# Patient Record
Sex: Female | Born: 1955 | Race: White | Hispanic: No | Marital: Married | Smoking: Never smoker
Health system: Southern US, Community
[De-identification: ages and names within clinical notes are randomized; demographics above are authoritative.]

---

## 2008-09-08 ENCOUNTER — Emergency Department (HOSPITAL_COMMUNITY): Admission: EM | Admit: 2008-09-08 | Discharge: 2008-09-08 | Payer: Self-pay | Admitting: Internal Medicine

## 2011-08-22 LAB — URINE MICROSCOPIC-ADD ON

## 2011-08-22 LAB — DIFFERENTIAL
Basophils Relative: 1
Eosinophils Absolute: 0
Lymphs Abs: 2.2
Neutrophils Relative %: 60

## 2011-08-22 LAB — URINALYSIS, ROUTINE W REFLEX MICROSCOPIC
Glucose, UA: >1000 — AB
Specific Gravity, Urine: 1.037 — ABNORMAL HIGH
Urobilinogen, UA: 0.2
pH: 6

## 2011-08-22 LAB — COMPREHENSIVE METABOLIC PANEL
ALT: 21
Albumin: 3.1 — ABNORMAL LOW
Alkaline Phosphatase: 122 — ABNORMAL HIGH
Potassium: 3.9
Sodium: 128 — ABNORMAL LOW
Total Protein: 7

## 2011-08-22 LAB — POCT I-STAT, CHEM 8
HCT: 37
Hemoglobin: 12.6
Potassium: 5.6 — ABNORMAL HIGH
Sodium: 130 — ABNORMAL LOW

## 2011-08-22 LAB — GLUCOSE, CAPILLARY

## 2011-08-22 LAB — CBC
MCV: 87
Platelets: 261
WBC: 6.8

## 2013-05-02 ENCOUNTER — Ambulatory Visit (HOSPITAL_COMMUNITY)
Admission: RE | Admit: 2013-05-02 | Discharge: 2013-05-02 | Disposition: A | Discharge status: Home / Self Care | Payer: BC Managed Care – PPO | Source: Ambulatory Visit | Attending: General Surgery | Admitting: General Surgery

## 2013-05-02 ENCOUNTER — Other Ambulatory Visit (HOSPITAL_BASED_OUTPATIENT_CLINIC_OR_DEPARTMENT_OTHER): Payer: Self-pay | Admitting: General Surgery

## 2013-05-02 ENCOUNTER — Encounter (HOSPITAL_BASED_OUTPATIENT_CLINIC_OR_DEPARTMENT_OTHER): Payer: BC Managed Care – PPO | Attending: General Surgery

## 2013-05-02 DIAGNOSIS — M869 Osteomyelitis, unspecified: Secondary | ICD-10-CM

## 2013-05-02 DIAGNOSIS — R059 Cough, unspecified: Secondary | ICD-10-CM | POA: Insufficient documentation

## 2013-05-02 DIAGNOSIS — T148XXA Other injury of unspecified body region, initial encounter: Secondary | ICD-10-CM

## 2013-05-02 DIAGNOSIS — M5380 Other specified dorsopathies, site unspecified: Secondary | ICD-10-CM | POA: Insufficient documentation

## 2013-05-02 DIAGNOSIS — R05 Cough: Secondary | ICD-10-CM | POA: Insufficient documentation

## 2013-05-02 DIAGNOSIS — Z794 Long term (current) use of insulin: Secondary | ICD-10-CM | POA: Insufficient documentation

## 2013-05-02 DIAGNOSIS — R0989 Other specified symptoms and signs involving the circulatory and respiratory systems: Secondary | ICD-10-CM | POA: Insufficient documentation

## 2013-05-02 DIAGNOSIS — I517 Cardiomegaly: Secondary | ICD-10-CM | POA: Insufficient documentation

## 2013-05-02 DIAGNOSIS — I10 Essential (primary) hypertension: Secondary | ICD-10-CM | POA: Insufficient documentation

## 2013-05-02 DIAGNOSIS — E119 Type 2 diabetes mellitus without complications: Secondary | ICD-10-CM | POA: Insufficient documentation

## 2013-05-02 DIAGNOSIS — M21969 Unspecified acquired deformity of unspecified lower leg: Secondary | ICD-10-CM | POA: Insufficient documentation

## 2013-05-02 DIAGNOSIS — E1169 Type 2 diabetes mellitus with other specified complication: Secondary | ICD-10-CM | POA: Insufficient documentation

## 2013-05-02 DIAGNOSIS — X58XXXA Exposure to other specified factors, initial encounter: Secondary | ICD-10-CM | POA: Insufficient documentation

## 2013-05-02 DIAGNOSIS — Z79899 Other long term (current) drug therapy: Secondary | ICD-10-CM | POA: Insufficient documentation

## 2013-05-02 DIAGNOSIS — S91109A Unspecified open wound of unspecified toe(s) without damage to nail, initial encounter: Secondary | ICD-10-CM | POA: Insufficient documentation

## 2013-05-02 DIAGNOSIS — L84 Corns and callosities: Secondary | ICD-10-CM | POA: Insufficient documentation

## 2013-05-02 DIAGNOSIS — M7730 Calcaneal spur, unspecified foot: Secondary | ICD-10-CM | POA: Insufficient documentation

## 2013-05-02 DIAGNOSIS — L97509 Non-pressure chronic ulcer of other part of unspecified foot with unspecified severity: Secondary | ICD-10-CM | POA: Insufficient documentation

## 2013-05-02 LAB — GLUCOSE, CAPILLARY: Glucose-Capillary: 173 mg/dL — ABNORMAL HIGH (ref 70–99)

## 2013-05-02 LAB — HEMOGLOBIN A1C: Mean Plasma Glucose: 223 mg/dL — ABNORMAL HIGH (ref <117)

## 2013-05-03 NOTE — Progress Notes (Signed)
[facility]  NAME:  Haley Stein, Haley Stein               ACCOUNT NO.:  [id number]  MEDICAL RECORD NO.:  [id number]      DATE OF BIRTH:  03/21/1956  PHYSICIAN:  Ardath Sax, M.D.           VISIT DATE:                                  OFFICE VISIT   This is a 57 year old type 2 diabetic, morbidly obese, who comes to [place] with a right plantar Wagner 2 diabetic foot ulcer.  It is right at the first MP joint on her right plantar surface.  It is about 0.5 cm in diameter.  I debrided it of callus and we are going to treat it with collagen and we also were going to x-ray to rule out osteomyelitis. This has been present for several months and it has been treated by a podiatrist and they have had some success treating it with debridements and offloading.  We are going to continue the offloading and use silver collagen.  She has a history of diabetes since she was in her early 46s.  She is 5 feet and 4 inches, weighs 225 pounds, temperature 98, pulse 96, respirations 16, blood pressure 163/85.  Her only surgery has been that she had a C-section in her 14s.  Her medications we are not sure about.  I know she takes an oral medicine for her diabetes and she says she also takes lisinopril, she is thinking it is 20 mg a day.  She takes gabapentin 300 mg, Lantus 83 units and NovoLog 75 units.  We are going to see her next week after treating this with collagen.  We will also see about hyperbaric oxygen treatments and we may see if we can get her to wear an easy cast.  DIAGNOSES: 1. Diabetic foot ulcer, Wagner 2 of the right first     metacarpophalangeal joint. 2. Diabetes. 3. Morbid obesity. 4. Hypertension.     Ardath Sax, M.D.     PP/MEDQ  D:  05/02/2013  T:  05/03/2013  Job:  [id number]

## 2013-05-21 ENCOUNTER — Encounter (HOSPITAL_BASED_OUTPATIENT_CLINIC_OR_DEPARTMENT_OTHER): Payer: BC Managed Care – PPO | Attending: General Surgery

## 2013-05-21 DIAGNOSIS — L97509 Non-pressure chronic ulcer of other part of unspecified foot with unspecified severity: Secondary | ICD-10-CM | POA: Insufficient documentation

## 2013-05-21 DIAGNOSIS — E1169 Type 2 diabetes mellitus with other specified complication: Secondary | ICD-10-CM | POA: Insufficient documentation

## 2013-06-20 ENCOUNTER — Encounter (HOSPITAL_BASED_OUTPATIENT_CLINIC_OR_DEPARTMENT_OTHER): Payer: BC Managed Care – PPO | Attending: General Surgery

## 2014-08-11 ENCOUNTER — Ambulatory Visit: Payer: Self-pay | Admitting: Podiatry

## 2014-09-01 ENCOUNTER — Ambulatory Visit: Payer: Self-pay | Admitting: Podiatry

## 2015-01-17 IMAGING — CR DG FOOT COMPLETE 3+V*R*
3 series · 3 of 3 positions shown · non-contrast
Comparison: None

CLINICAL DATA: Osteomyelitis, wound at plantar surface of right
foot under great toe, history diabetes

RIGHT FOOT COMPLETE - 3+ VIEW

[t foot ap right]
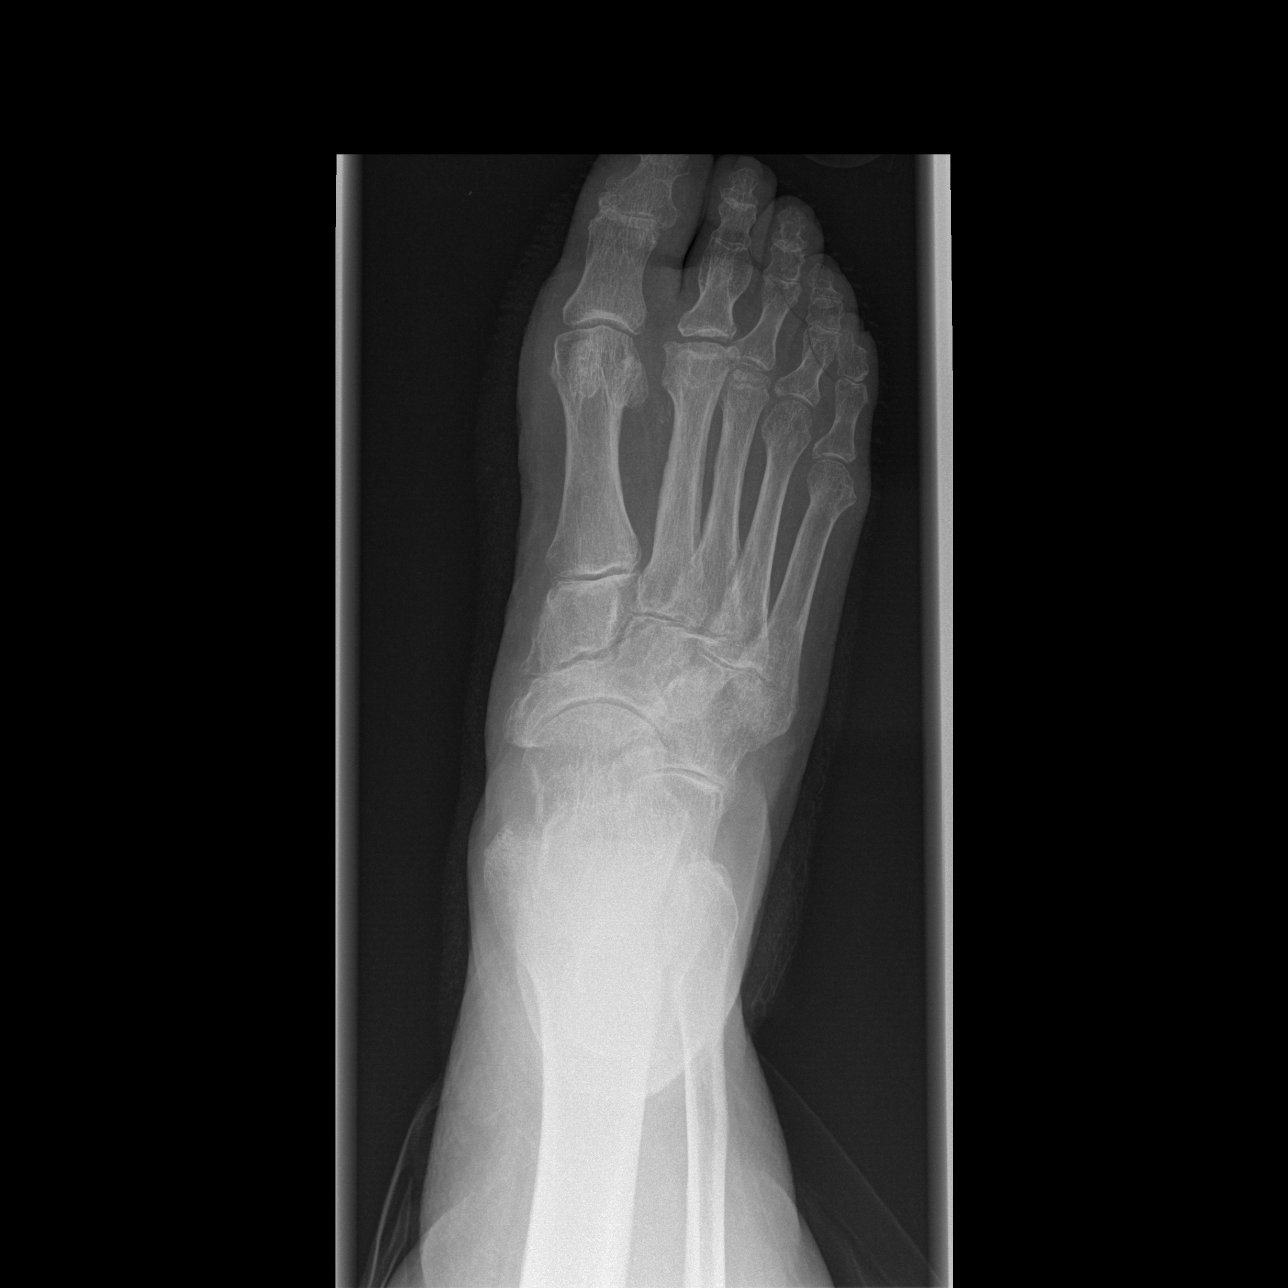

[t foot oblique right]
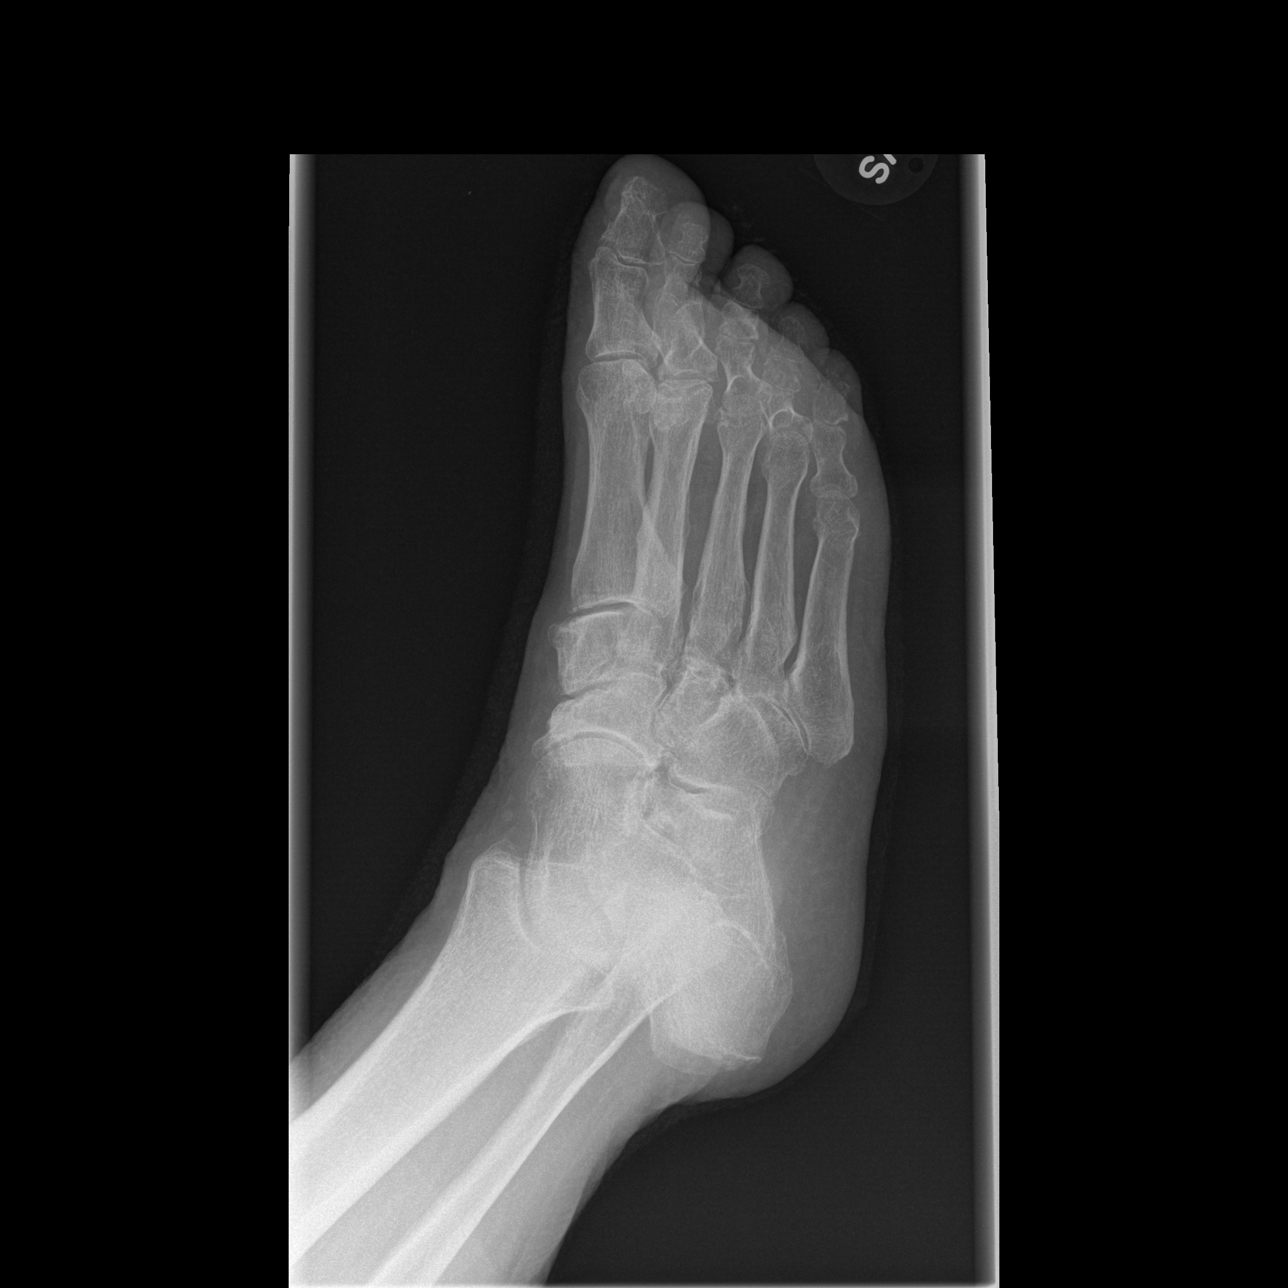

[t foot lat right]
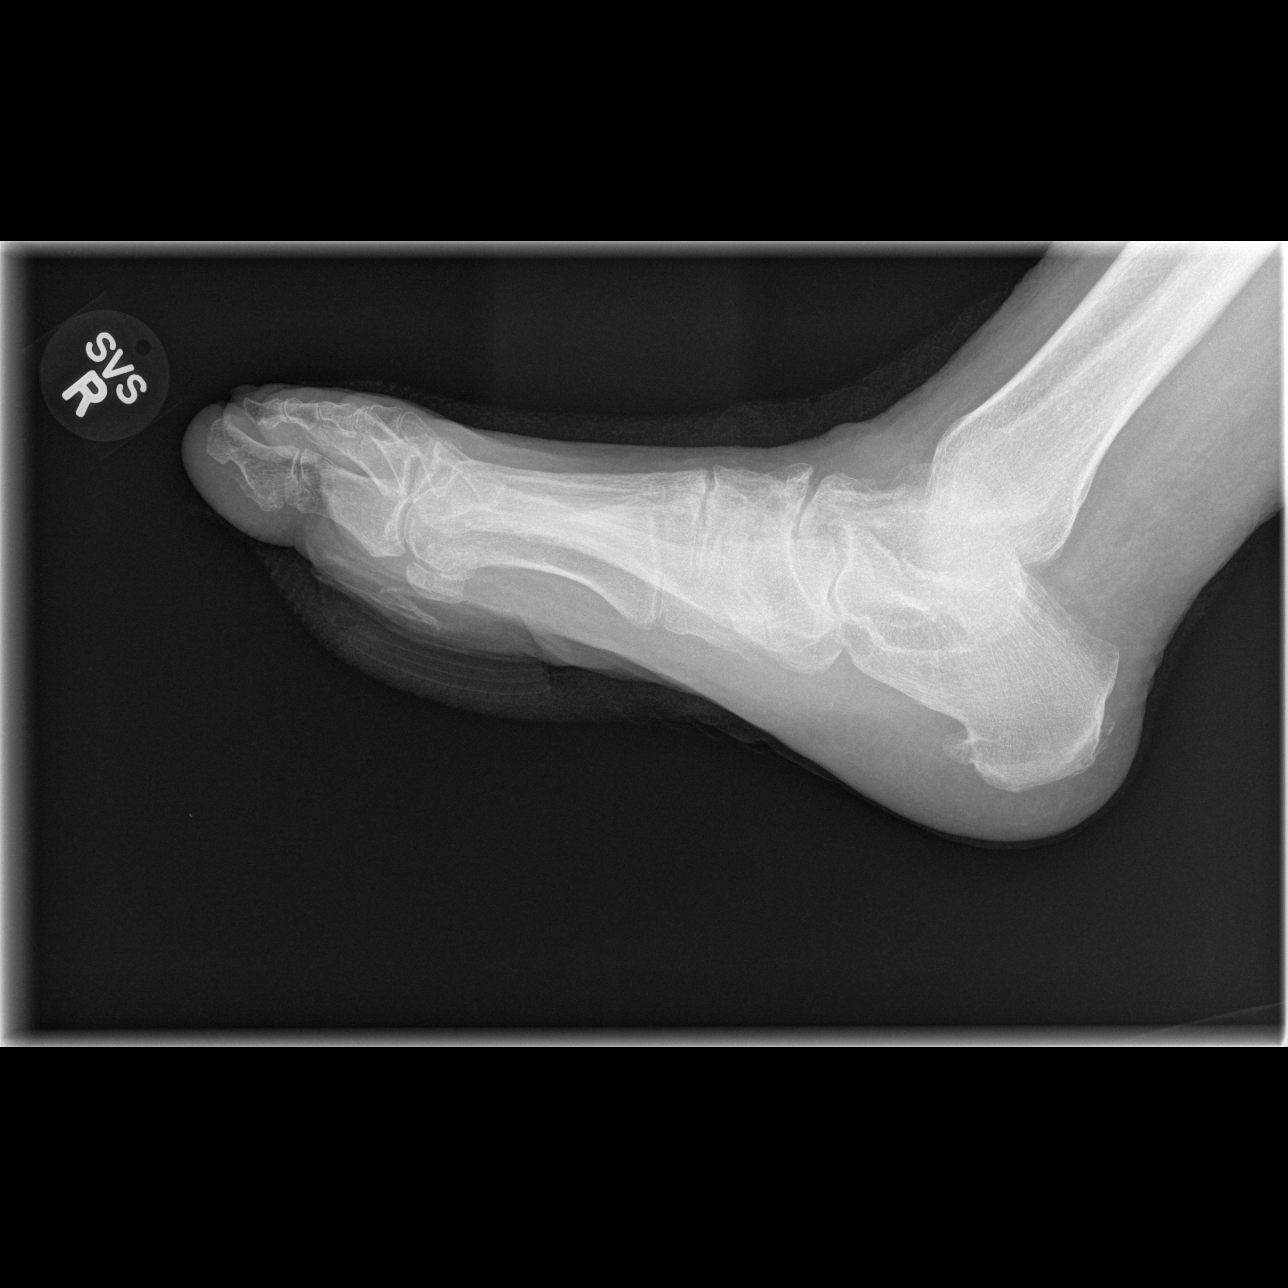

[3 of 3 positions shown; findings below may reference images not displayed]

FINDINGS: Diffuse osseous demineralization.
Flattening deformity of the second metatarsal head question
Joshjax infraction.
Probable healed post-traumatic deformity proximal phalanx second
toe.
Minimal degenerative changes first MTP joint.
Scattered intertarsal degenerative changes.
No definite fracture, dislocation, or bone destruction.
Plantar and Achilles insertion calcaneal spurs.
Soft tissue/dressing artifacts are present at the plantar aspect of
the foot though no definite focal bone destruction is seen to
suggest osteomyelitis.
IMPRESSION: Osseous demineralization with scattered degenerative changes as
above.
No definite evidence of osteomyelitis.

## 2015-01-17 IMAGING — CR DG CHEST 2V
2 series · 2 of 2 positions shown · non-contrast
Comparison: None

CLINICAL DATA: Cough, congestion, hypertension, diabetes, pre
hyperbaric oxygen therapy

CHEST - 2 VIEW

[w chest pa]
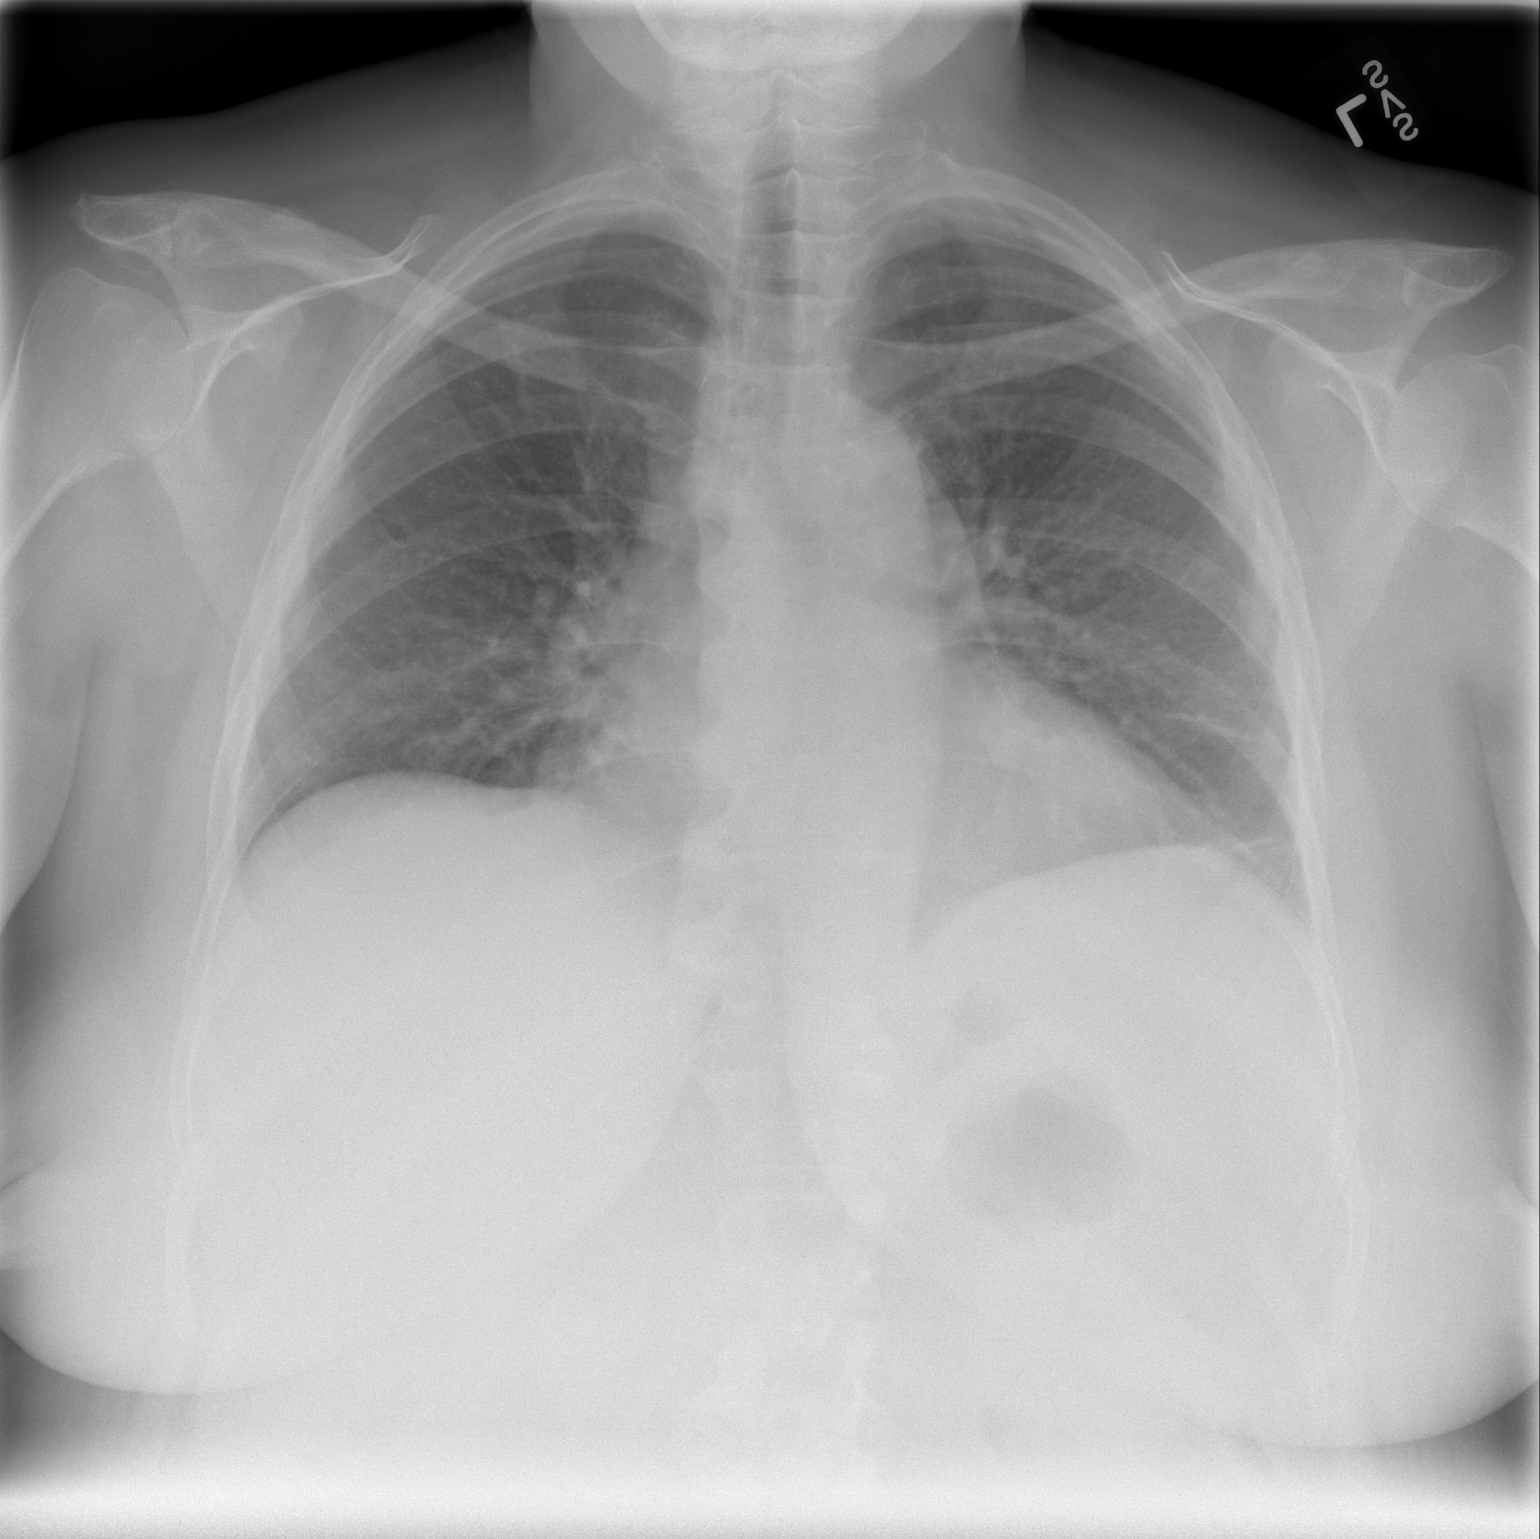

[w chest lat]
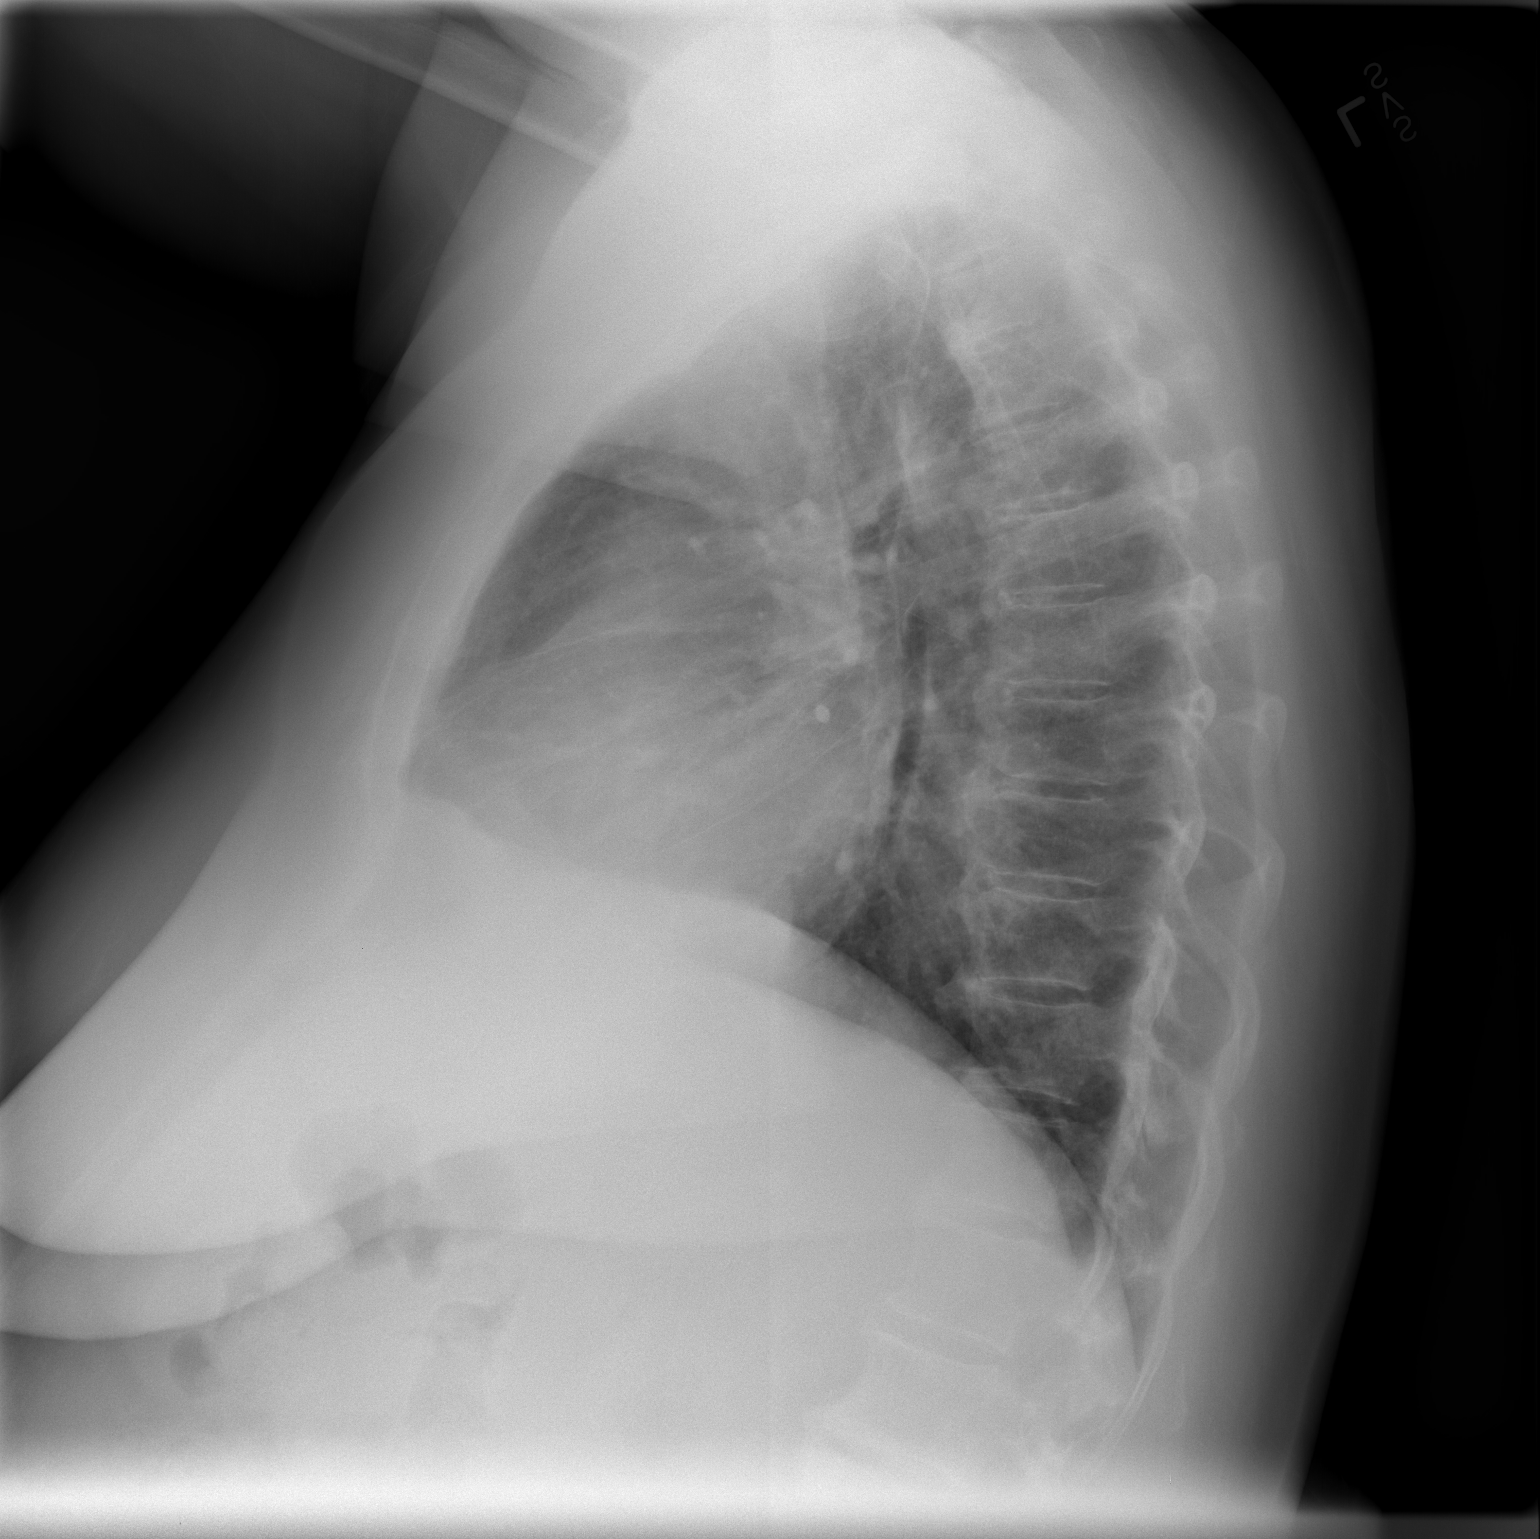

[2 of 2 positions shown; findings below may reference images not displayed]

FINDINGS: Enlargement of cardiac silhouette with pulmonary vascular
congestion.
Low lung volumes with mild bibasilar atelectasis and crowding of
perihilar markings.
No gross acute failure or consolidation.
No pleural effusion or pneumothorax.
Scattered endplate spur formation thoracic spine.
IMPRESSION: Enlargement of cardiac silhouette.
Low lung volumes with bibasilar atelectasis.

## 2015-07-01 ENCOUNTER — Ambulatory Visit (INDEPENDENT_AMBULATORY_CARE_PROVIDER_SITE_OTHER): Payer: BLUE CROSS/BLUE SHIELD | Admitting: Podiatry

## 2015-07-01 ENCOUNTER — Ambulatory Visit (INDEPENDENT_AMBULATORY_CARE_PROVIDER_SITE_OTHER): Payer: BLUE CROSS/BLUE SHIELD

## 2015-07-01 ENCOUNTER — Encounter: Payer: Self-pay | Admitting: Podiatry

## 2015-07-01 VITALS — BP 163/90 | HR 86 | Resp 12

## 2015-07-01 DIAGNOSIS — B079 Viral wart, unspecified: Secondary | ICD-10-CM | POA: Diagnosis not present

## 2015-07-01 DIAGNOSIS — R52 Pain, unspecified: Secondary | ICD-10-CM | POA: Diagnosis not present

## 2015-07-01 DIAGNOSIS — L603 Nail dystrophy: Secondary | ICD-10-CM | POA: Diagnosis not present

## 2015-07-01 DIAGNOSIS — B351 Tinea unguium: Secondary | ICD-10-CM

## 2015-07-01 DIAGNOSIS — M79673 Pain in unspecified foot: Secondary | ICD-10-CM | POA: Diagnosis not present

## 2015-07-01 DIAGNOSIS — E1142 Type 2 diabetes mellitus with diabetic polyneuropathy: Secondary | ICD-10-CM

## 2015-07-01 NOTE — Progress Notes (Signed)
She presents today after having not seen her for quite some time requesting new orthotics. She goes on to say that she has had medication of the fourth digit of the left foot secondary to osteomyelitis. She also states that she has had a wart to the plantar medial aspect of the right foot that was open and has been treated at [facility] to close the wound. She states that her diabetes is under better control.  Objective: Vital signs are stable she is alert and oriented 3. Pulses are strongly palpable bilateral. Neurologic sensorium diminished per Semmes-Weinstein monofilament. Deep tendon reflexes are intact bilateral and muscle strength is normal bilateral. Orthopedic evaluation demonstrates all joints distal to the ankle for range of motion without crepitation. She does have amputation at the level of the metatarsophalangeal joint fourth digit left foot. Cutaneous evaluation demonstrates thick yellow dystrophic onychomycotic nails with a very large verrucoid lesion to the plantar medial aspect of the first metatarsophalangeal joint. This demonstrates thrombosed capillaries and reactive hyperkeratosis surrounding it. Currently I see no signs of bacterial infection to the bilateral foot.  Assessment 59 year old white female history of diabetic peripheral neuropathy history of amputation fourth digit left foot pain and limb secondary to onychomycosis and verrucoid lesion plantar aspect right foot.  Plan: Discussed etiology and pathology conservative versus surgical therapies. At this point I recommended she obtain her orthotics from [facility]. I debrided her painful nails 1 through 5 of the right foot and 1 through 5 of the left foot with exception of the fourth digit left which was dictated. We did discuss the need for treatment of the wart which will consist of Efudex cream. We will prescribe Efudex cream after this wart has healed for another 6 weeks. Should she have questions or concerns  or develops signs or symptoms of infection to the bilateral foot she's notify us immediately.

## 2015-08-12 ENCOUNTER — Ambulatory Visit: Payer: BLUE CROSS/BLUE SHIELD | Admitting: Podiatry

## 2017-02-18 DEATH — deceased
